# Patient Record
Sex: Female | Born: 1960 | Hispanic: No | Marital: Married | Smoking: Never smoker
Health system: Southern US, Community
[De-identification: ages and names within clinical notes are randomized; demographics above are authoritative.]

## PROBLEM LIST (undated history)

## (undated) DIAGNOSIS — E119 Type 2 diabetes mellitus without complications: Secondary | ICD-10-CM

## (undated) DIAGNOSIS — I10 Essential (primary) hypertension: Secondary | ICD-10-CM

## (undated) DIAGNOSIS — E785 Hyperlipidemia, unspecified: Secondary | ICD-10-CM

## (undated) HISTORY — DX: Hyperlipidemia, unspecified: E78.5

## (undated) HISTORY — DX: Type 2 diabetes mellitus without complications: E11.9

## (undated) HISTORY — DX: Essential (primary) hypertension: I10

---

## 2021-03-07 ENCOUNTER — Other Ambulatory Visit (HOSPITAL_COMMUNITY): Payer: Self-pay | Admitting: Internal Medicine

## 2021-03-07 DIAGNOSIS — Z1231 Encounter for screening mammogram for malignant neoplasm of breast: Secondary | ICD-10-CM

## 2021-03-07 DIAGNOSIS — M810 Age-related osteoporosis without current pathological fracture: Secondary | ICD-10-CM

## 2021-03-23 ENCOUNTER — Other Ambulatory Visit (HOSPITAL_COMMUNITY): Payer: Self-pay

## 2021-03-23 ENCOUNTER — Ambulatory Visit (HOSPITAL_COMMUNITY): Payer: Self-pay

## 2021-05-11 ENCOUNTER — Ambulatory Visit (HOSPITAL_COMMUNITY)
Admission: RE | Admit: 2021-05-11 | Discharge: 2021-05-11 | Disposition: A | Discharge status: Home / Self Care | Payer: 59 | Source: Ambulatory Visit | Attending: Internal Medicine | Admitting: Internal Medicine

## 2021-05-11 ENCOUNTER — Other Ambulatory Visit: Payer: Self-pay

## 2021-05-11 DIAGNOSIS — M810 Age-related osteoporosis without current pathological fracture: Secondary | ICD-10-CM | POA: Insufficient documentation

## 2021-05-11 DIAGNOSIS — Z1231 Encounter for screening mammogram for malignant neoplasm of breast: Secondary | ICD-10-CM | POA: Insufficient documentation

## 2021-08-09 ENCOUNTER — Other Ambulatory Visit: Payer: Self-pay

## 2021-08-09 ENCOUNTER — Encounter: Payer: Self-pay | Admitting: Obstetrics & Gynecology

## 2021-08-09 ENCOUNTER — Ambulatory Visit: Payer: 59 | Admitting: Obstetrics & Gynecology

## 2021-08-09 ENCOUNTER — Other Ambulatory Visit (HOSPITAL_COMMUNITY)
Admission: RE | Admit: 2021-08-09 | Discharge: 2021-08-09 | Disposition: A | Discharge status: Home / Self Care | Payer: 59 | Source: Ambulatory Visit | Attending: Obstetrics & Gynecology | Admitting: Obstetrics & Gynecology

## 2021-08-09 VITALS — BP 107/69 | HR 82 | Ht 65.0 in | Wt 196.2 lb

## 2021-08-09 DIAGNOSIS — Z01419 Encounter for gynecological examination (general) (routine) without abnormal findings: Secondary | ICD-10-CM | POA: Insufficient documentation

## 2021-08-09 DIAGNOSIS — N393 Stress incontinence (female) (male): Secondary | ICD-10-CM | POA: Diagnosis not present

## 2021-08-09 DIAGNOSIS — Z124 Encounter for screening for malignant neoplasm of cervix: Secondary | ICD-10-CM | POA: Diagnosis not present

## 2021-08-09 NOTE — Progress Notes (Signed)
/    WELL-WOMAN EXAMINATION Patient name: Summer Villarreal MRN [id number]  Date of birth: 31-Oct-1960 Chief Complaint:   Gynecologic Exam  History of Present Illness:   Summer Villarreal is a 60 y.o. PM female being seen today for a routine well-woman exam.   Presents with interpreter- Urdu- present for the entire visit  Today she notes no bleeding since going through menopause.  Denies vaginal discharge, itching or irritation.  Denies pelvic or abdominal pain.  She does note on occasion that when she lifts something heavy or bends down she may leak a few drops of urine.  Denies urgency or frequency.   She also on occasion will have RLQ pain, per pt it seems related to her diet especially if she eats a lot of sugar.  No LMP recorded. Patient is postmenopausal.   Last pap ~25yrs ago.  Last mammogram: 05/2021. Last colonoscopy: 2-11yrs ago- [facility]  Depression screen [facility] 2/9 08/09/2021  Decreased Interest 0  Down, Depressed, Hopeless 0  PHQ - 2 Score 0  Altered sleeping 0  Tired, decreased energy 0  Change in appetite 0  Feeling bad or failure about yourself  0  Trouble concentrating 0  Moving slowly or fidgety/restless 0  Suicidal thoughts 0  PHQ-9 Score 0      Review of Systems:   Pertinent items are noted in HPI Denies any headaches, blurred vision, fatigue, shortness of breath, chest pain, abdominal pain, bowel movements, or intercourse unless otherwise stated above.  Pertinent History Reviewed:  Reviewed past medical,surgical, social and family history.  Reviewed problem list, medications and allergies. Physical Assessment:   Vitals:   08/09/21 1530  BP: 107/69  Pulse: 82  Weight: 196 lb 3.2 oz (89 kg)  Height: 5\' 5"  (1.651 m)  Body mass index is 32.65 kg/m.        Physical Examination:   General appearance - well appearing, and in no distress  Mental status - alert, oriented to person, place, and time  Psych:  She has a normal mood and affect  Skin - warm  and dry, normal color, no suspicious lesions noted  Chest - effort normal, all lung fields clear to auscultation bilaterally  Heart - normal rate and regular rhythm  Neck:  midline trachea, no thyromegaly or nodules  Breasts - breasts appear normal, no suspicious masses, no skin or nipple changes or  axillary nodes  Abdomen - soft, nontender, nondistended, no masses or organomegaly  Pelvic - VULVA: normal appearing vulva with no masses, tenderness or lesions  VAGINA: normal appearing vagina with normal color and discharge, no lesions  CERVIX: normal appearing cervix without discharge or lesions, no CMT  Thin prep pap is done with HR HPV cotesting  UTERUS: uterus is felt to be normal size, shape, consistency and nontender   ADNEXA: No adnexal masses or tenderness noted.  Extremities:  No swelling or varicosities noted  Chaperone:     Assessment & Plan:  1) Well-Woman Exam -pap collected reviewed guidelines -encouraged mammogram yearly  2) Stress incontinence -reviewed conservative therapy -should this become a more aggravating problem, RTC  Meds: No orders of the defined types were placed in this encounter.   Follow-up: Return in about 1 year (around 08/09/2022) for Annual.   08/11/2022, DO Attending Obstetrician & Gynecologist, [facility] for [facility], [facility]

## 2021-08-16 LAB — CYTOLOGY - PAP
Comment: NEGATIVE
Diagnosis: NEGATIVE
High risk HPV: NEGATIVE

## 2021-08-18 ENCOUNTER — Telehealth: Payer: Self-pay

## 2021-08-18 NOTE — Telephone Encounter (Signed)
-----   Message from Myna Hidalgo, DO sent at 08/17/2021  5:54 PM EST ----- Pap/HPV negative

## 2021-08-18 NOTE — Telephone Encounter (Signed)
Due to language barrier, language line used to interpret. Two identifiers used. Test results given to pt per Dr Charlotta Newton. Pt confirmed understanding.

## 2021-11-28 DIAGNOSIS — I10 Essential (primary) hypertension: Secondary | ICD-10-CM | POA: Diagnosis not present

## 2021-11-28 DIAGNOSIS — Z6832 Body mass index (BMI) 32.0-32.9, adult: Secondary | ICD-10-CM | POA: Diagnosis not present

## 2021-11-28 DIAGNOSIS — E1143 Type 2 diabetes mellitus with diabetic autonomic (poly)neuropathy: Secondary | ICD-10-CM | POA: Diagnosis not present

## 2021-11-28 DIAGNOSIS — E7849 Other hyperlipidemia: Secondary | ICD-10-CM | POA: Diagnosis not present

## 2022-03-27 DIAGNOSIS — I10 Essential (primary) hypertension: Secondary | ICD-10-CM | POA: Diagnosis not present

## 2022-03-27 DIAGNOSIS — E7849 Other hyperlipidemia: Secondary | ICD-10-CM | POA: Diagnosis not present

## 2022-03-27 DIAGNOSIS — Z0000 Encounter for general adult medical examination without abnormal findings: Secondary | ICD-10-CM | POA: Diagnosis not present

## 2022-03-27 DIAGNOSIS — Z6832 Body mass index (BMI) 32.0-32.9, adult: Secondary | ICD-10-CM | POA: Diagnosis not present

## 2022-03-27 DIAGNOSIS — E1143 Type 2 diabetes mellitus with diabetic autonomic (poly)neuropathy: Secondary | ICD-10-CM | POA: Diagnosis not present

## 2022-03-29 ENCOUNTER — Other Ambulatory Visit (HOSPITAL_COMMUNITY): Payer: Self-pay | Admitting: Internal Medicine

## 2022-03-29 DIAGNOSIS — Z1231 Encounter for screening mammogram for malignant neoplasm of breast: Secondary | ICD-10-CM

## 2022-04-19 ENCOUNTER — Ambulatory Visit (HOSPITAL_COMMUNITY): Payer: Self-pay

## 2022-05-29 ENCOUNTER — Ambulatory Visit (HOSPITAL_COMMUNITY)
Admission: RE | Admit: 2022-05-29 | Discharge: 2022-05-29 | Disposition: A | Discharge status: Home / Self Care | Payer: 59 | Source: Ambulatory Visit | Attending: Internal Medicine | Admitting: Internal Medicine

## 2022-05-29 DIAGNOSIS — Z1231 Encounter for screening mammogram for malignant neoplasm of breast: Secondary | ICD-10-CM | POA: Insufficient documentation

## 2022-08-10 DIAGNOSIS — E1143 Type 2 diabetes mellitus with diabetic autonomic (poly)neuropathy: Secondary | ICD-10-CM | POA: Diagnosis not present

## 2022-08-10 DIAGNOSIS — I10 Essential (primary) hypertension: Secondary | ICD-10-CM | POA: Diagnosis not present

## 2022-08-10 DIAGNOSIS — E7849 Other hyperlipidemia: Secondary | ICD-10-CM | POA: Diagnosis not present

## 2022-08-10 DIAGNOSIS — Z6831 Body mass index (BMI) 31.0-31.9, adult: Secondary | ICD-10-CM | POA: Diagnosis not present

## 2022-12-25 DIAGNOSIS — E669 Obesity, unspecified: Secondary | ICD-10-CM | POA: Diagnosis not present

## 2022-12-25 DIAGNOSIS — E1143 Type 2 diabetes mellitus with diabetic autonomic (poly)neuropathy: Secondary | ICD-10-CM | POA: Diagnosis not present

## 2022-12-25 DIAGNOSIS — E7849 Other hyperlipidemia: Secondary | ICD-10-CM | POA: Diagnosis not present

## 2022-12-25 DIAGNOSIS — Z6831 Body mass index (BMI) 31.0-31.9, adult: Secondary | ICD-10-CM | POA: Diagnosis not present

## 2022-12-25 DIAGNOSIS — I10 Essential (primary) hypertension: Secondary | ICD-10-CM | POA: Diagnosis not present

## 2023-02-07 DIAGNOSIS — E1142 Type 2 diabetes mellitus with diabetic polyneuropathy: Secondary | ICD-10-CM | POA: Diagnosis not present

## 2023-02-07 DIAGNOSIS — E785 Hyperlipidemia, unspecified: Secondary | ICD-10-CM | POA: Diagnosis not present

## 2023-02-07 DIAGNOSIS — E669 Obesity, unspecified: Secondary | ICD-10-CM | POA: Diagnosis not present

## 2023-02-07 DIAGNOSIS — R32 Unspecified urinary incontinence: Secondary | ICD-10-CM | POA: Diagnosis not present

## 2023-02-07 DIAGNOSIS — I10 Essential (primary) hypertension: Secondary | ICD-10-CM | POA: Diagnosis not present

## 2023-02-07 DIAGNOSIS — Z6832 Body mass index (BMI) 32.0-32.9, adult: Secondary | ICD-10-CM | POA: Diagnosis not present

## 2023-02-07 DIAGNOSIS — Z7984 Long term (current) use of oral hypoglycemic drugs: Secondary | ICD-10-CM | POA: Diagnosis not present

## 2023-02-07 DIAGNOSIS — R600 Localized edema: Secondary | ICD-10-CM | POA: Diagnosis not present

## 2023-04-10 IMAGING — MG MM DIGITAL SCREENING BILAT W/ TOMO AND CAD
6 of 12 series · 6 of 36 positions shown · non-contrast
Comparison: Previous exam(s).

CLINICAL DATA: Screening.

EXAM:
DIGITAL SCREENING BILATERAL MAMMOGRAM WITH TOMOSYNTHESIS AND CAD
TECHNIQUE: Bilateral screening digital craniocaudal and mediolateral oblique
mammograms were obtained. Bilateral screening digital breast
tomosynthesis was performed. The images were evaluated with
computer-aided detection.

[R CC synth-2D (1 of 2)]
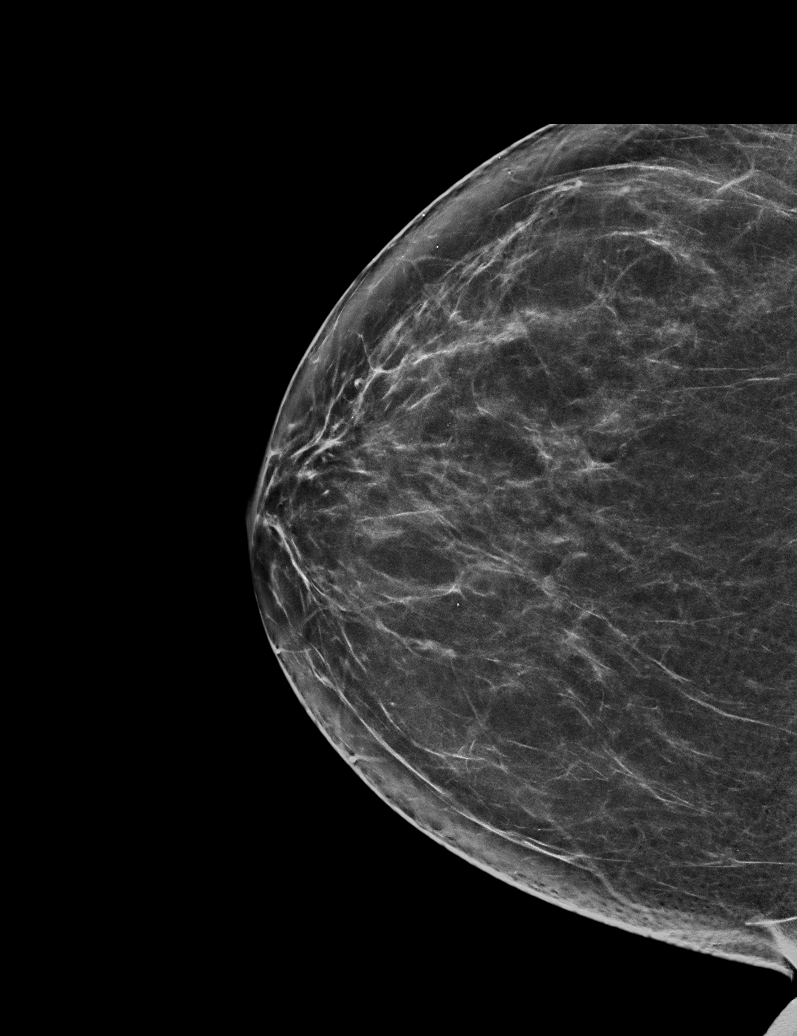

[R CC synth-2D (2 of 2)]
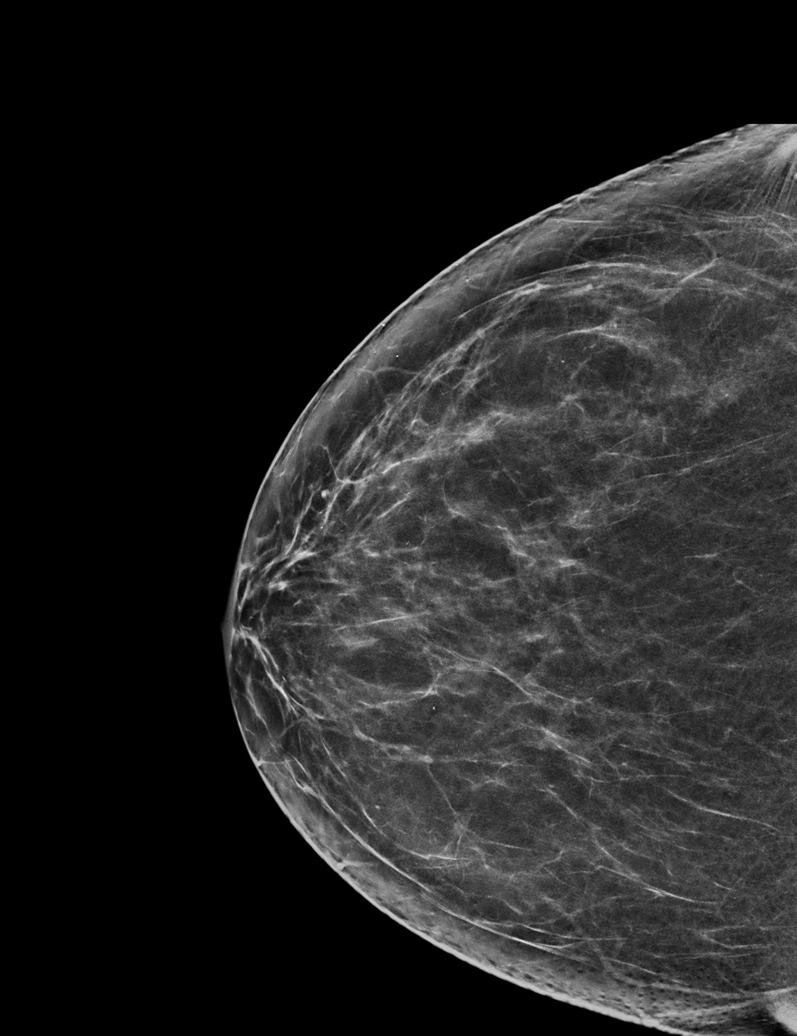

[L MLO synth-2D]
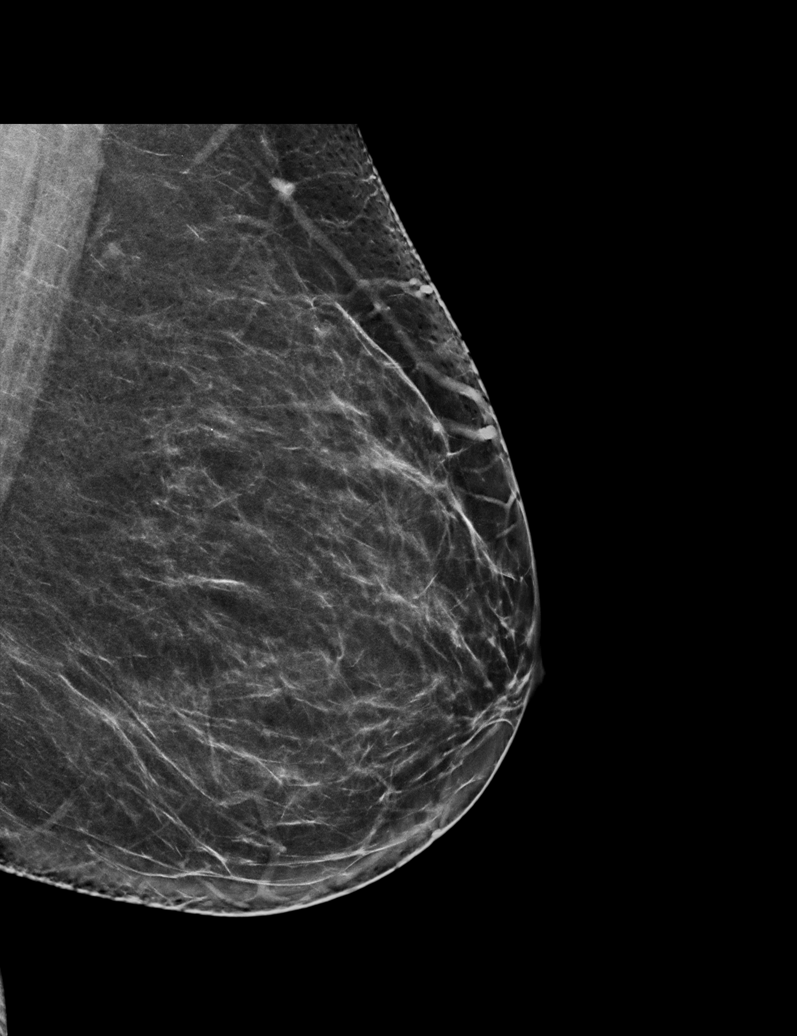

[L CC synth-2D]
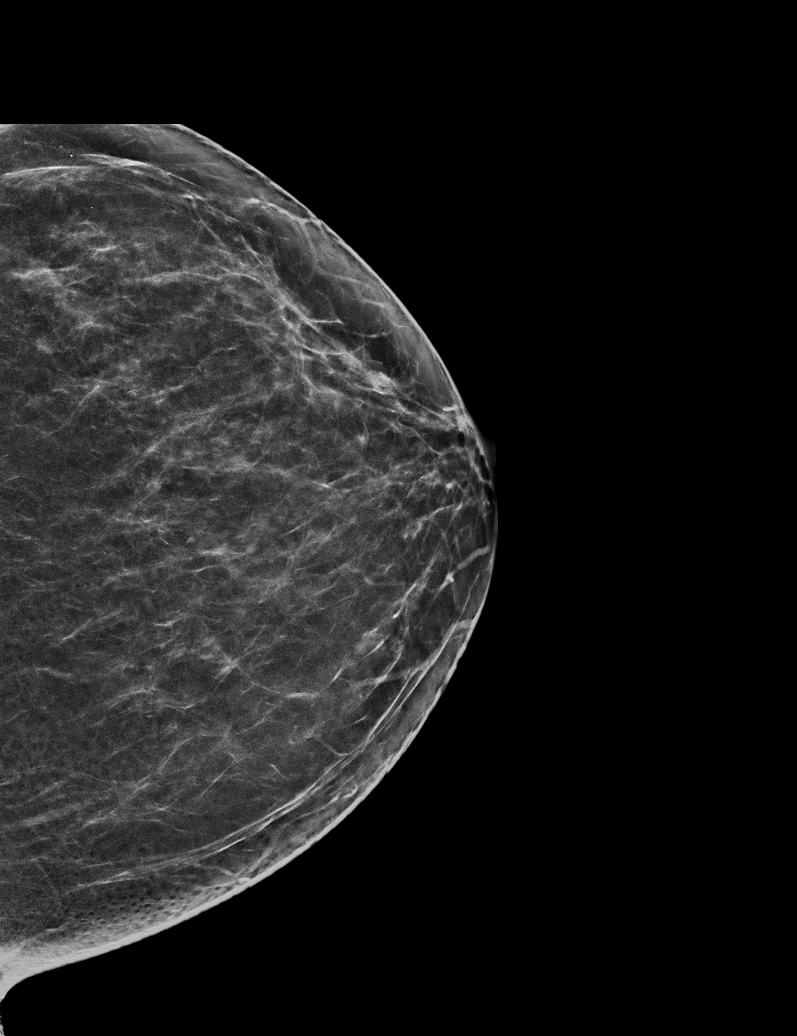

[R MLO synth-2D (1 of 2)]
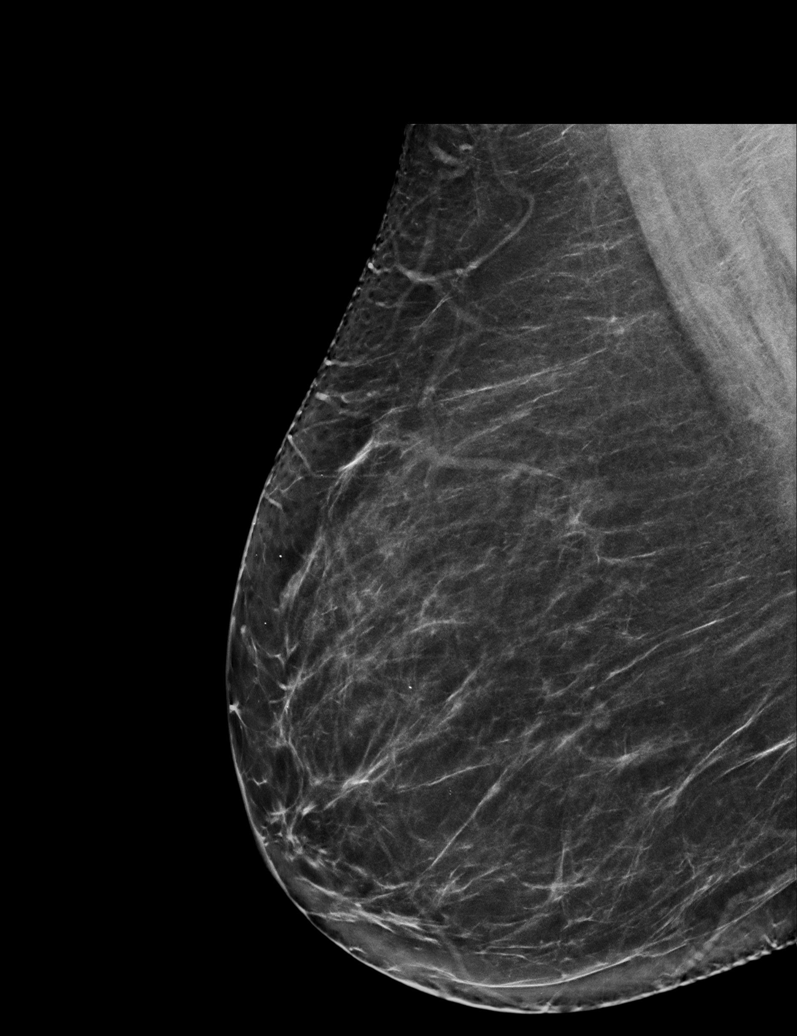

[R MLO synth-2D (2 of 2)]
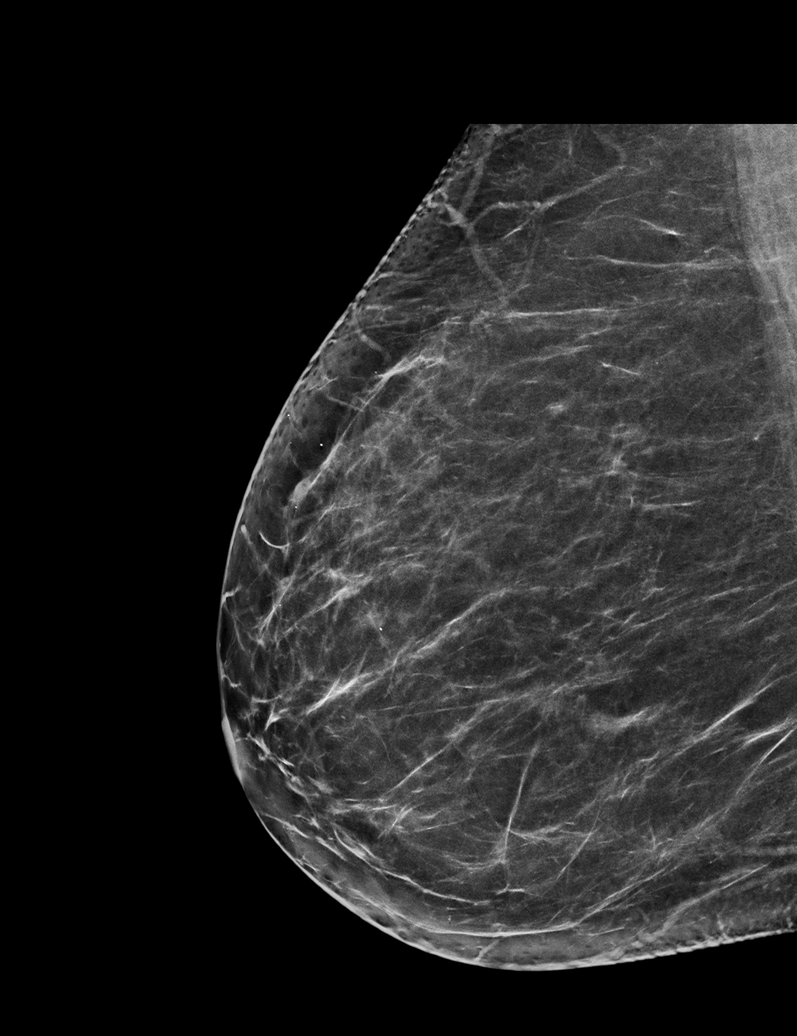

[6 of 36 positions shown; findings below may reference images not displayed]

ACR Breast Density Category b: There are scattered areas of
fibroglandular density.
FINDINGS: There are no findings suspicious for malignancy.
IMPRESSION: No mammographic evidence of malignancy. A result letter of this
screening mammogram will be mailed directly to the patient.

RECOMMENDATION:
Screening mammogram in one year. (Code:51-O-LD2)

BI-RADS CATEGORY  1: Negative.

## 2023-05-03 ENCOUNTER — Other Ambulatory Visit (HOSPITAL_COMMUNITY): Payer: Self-pay | Admitting: Internal Medicine

## 2023-05-03 DIAGNOSIS — Z1231 Encounter for screening mammogram for malignant neoplasm of breast: Secondary | ICD-10-CM

## 2023-05-10 DIAGNOSIS — Z0000 Encounter for general adult medical examination without abnormal findings: Secondary | ICD-10-CM | POA: Diagnosis not present

## 2023-05-10 DIAGNOSIS — Z6831 Body mass index (BMI) 31.0-31.9, adult: Secondary | ICD-10-CM | POA: Diagnosis not present

## 2023-06-01 ENCOUNTER — Ambulatory Visit (HOSPITAL_COMMUNITY)
Admission: RE | Admit: 2023-06-01 | Discharge: 2023-06-01 | Disposition: A | Discharge status: Home / Self Care | Payer: 59 | Source: Ambulatory Visit | Attending: Internal Medicine | Admitting: Internal Medicine

## 2023-06-01 ENCOUNTER — Encounter (HOSPITAL_COMMUNITY): Payer: Self-pay

## 2023-06-01 DIAGNOSIS — Z1231 Encounter for screening mammogram for malignant neoplasm of breast: Secondary | ICD-10-CM | POA: Insufficient documentation

## 2023-08-13 DIAGNOSIS — J180 Bronchopneumonia, unspecified organism: Secondary | ICD-10-CM | POA: Diagnosis not present

## 2023-08-13 DIAGNOSIS — I10 Essential (primary) hypertension: Secondary | ICD-10-CM | POA: Diagnosis not present

## 2023-08-13 DIAGNOSIS — E7849 Other hyperlipidemia: Secondary | ICD-10-CM | POA: Diagnosis not present

## 2023-08-13 DIAGNOSIS — N182 Chronic kidney disease, stage 2 (mild): Secondary | ICD-10-CM | POA: Diagnosis not present

## 2023-08-13 DIAGNOSIS — Z6830 Body mass index (BMI) 30.0-30.9, adult: Secondary | ICD-10-CM | POA: Diagnosis not present

## 2023-08-13 DIAGNOSIS — E1122 Type 2 diabetes mellitus with diabetic chronic kidney disease: Secondary | ICD-10-CM | POA: Diagnosis not present

## 2023-08-13 DIAGNOSIS — E669 Obesity, unspecified: Secondary | ICD-10-CM | POA: Diagnosis not present

## 2023-12-20 DIAGNOSIS — N182 Chronic kidney disease, stage 2 (mild): Secondary | ICD-10-CM | POA: Diagnosis not present

## 2023-12-20 DIAGNOSIS — E1122 Type 2 diabetes mellitus with diabetic chronic kidney disease: Secondary | ICD-10-CM | POA: Diagnosis not present

## 2023-12-20 DIAGNOSIS — Z6831 Body mass index (BMI) 31.0-31.9, adult: Secondary | ICD-10-CM | POA: Diagnosis not present

## 2023-12-20 DIAGNOSIS — I10 Essential (primary) hypertension: Secondary | ICD-10-CM | POA: Diagnosis not present

## 2023-12-20 DIAGNOSIS — E66812 Obesity, class 2: Secondary | ICD-10-CM | POA: Diagnosis not present

## 2023-12-20 DIAGNOSIS — E7849 Other hyperlipidemia: Secondary | ICD-10-CM | POA: Diagnosis not present

## 2023-12-20 DIAGNOSIS — Z1159 Encounter for screening for other viral diseases: Secondary | ICD-10-CM | POA: Diagnosis not present

## 2024-01-31 DIAGNOSIS — L720 Epidermal cyst: Secondary | ICD-10-CM | POA: Diagnosis not present

## 2024-01-31 DIAGNOSIS — Z6831 Body mass index (BMI) 31.0-31.9, adult: Secondary | ICD-10-CM | POA: Diagnosis not present

## 2024-04-23 ENCOUNTER — Other Ambulatory Visit (HOSPITAL_COMMUNITY): Payer: Self-pay | Admitting: Internal Medicine

## 2024-04-23 DIAGNOSIS — Z1231 Encounter for screening mammogram for malignant neoplasm of breast: Secondary | ICD-10-CM

## 2024-05-22 DIAGNOSIS — E66812 Obesity, class 2: Secondary | ICD-10-CM | POA: Diagnosis not present

## 2024-05-22 DIAGNOSIS — N182 Chronic kidney disease, stage 2 (mild): Secondary | ICD-10-CM | POA: Diagnosis not present

## 2024-05-22 DIAGNOSIS — Z6829 Body mass index (BMI) 29.0-29.9, adult: Secondary | ICD-10-CM | POA: Diagnosis not present

## 2024-05-22 DIAGNOSIS — E1122 Type 2 diabetes mellitus with diabetic chronic kidney disease: Secondary | ICD-10-CM | POA: Diagnosis not present

## 2024-05-22 DIAGNOSIS — I10 Essential (primary) hypertension: Secondary | ICD-10-CM | POA: Diagnosis not present

## 2024-05-22 DIAGNOSIS — E7849 Other hyperlipidemia: Secondary | ICD-10-CM | POA: Diagnosis not present

## 2024-06-25 ENCOUNTER — Ambulatory Visit (HOSPITAL_COMMUNITY)
Admission: RE | Admit: 2024-06-25 | Discharge: 2024-06-25 | Disposition: A | Discharge status: Home / Self Care | Source: Ambulatory Visit | Attending: Internal Medicine | Admitting: Internal Medicine

## 2024-06-25 ENCOUNTER — Encounter (HOSPITAL_COMMUNITY): Payer: Self-pay

## 2024-06-25 DIAGNOSIS — Z1231 Encounter for screening mammogram for malignant neoplasm of breast: Secondary | ICD-10-CM | POA: Insufficient documentation

## 2024-06-27 ENCOUNTER — Inpatient Hospital Stay
Admission: RE | Admit: 2024-06-27 | Discharge: 2024-06-27 | Disposition: A | Discharge status: Home / Self Care | Payer: Self-pay | Source: Ambulatory Visit | Attending: Internal Medicine | Admitting: Internal Medicine

## 2024-06-27 ENCOUNTER — Other Ambulatory Visit (HOSPITAL_COMMUNITY): Payer: Self-pay | Admitting: Internal Medicine

## 2024-06-27 DIAGNOSIS — Z1231 Encounter for screening mammogram for malignant neoplasm of breast: Secondary | ICD-10-CM

## 2024-07-25 DIAGNOSIS — N182 Chronic kidney disease, stage 2 (mild): Secondary | ICD-10-CM | POA: Diagnosis not present

## 2024-07-25 DIAGNOSIS — E1122 Type 2 diabetes mellitus with diabetic chronic kidney disease: Secondary | ICD-10-CM | POA: Diagnosis not present

## 2024-07-25 DIAGNOSIS — E7849 Other hyperlipidemia: Secondary | ICD-10-CM | POA: Diagnosis not present

## 2024-08-05 ENCOUNTER — Other Ambulatory Visit (HOSPITAL_COMMUNITY)
Admission: RE | Admit: 2024-08-05 | Discharge: 2024-08-05 | Disposition: A | Discharge status: Home / Self Care | Source: Ambulatory Visit | Attending: Internal Medicine | Admitting: Internal Medicine

## 2024-08-05 DIAGNOSIS — E1122 Type 2 diabetes mellitus with diabetic chronic kidney disease: Secondary | ICD-10-CM | POA: Insufficient documentation

## 2024-08-06 LAB — MICROALBUMIN / CREATININE URINE RATIO
Creatinine, Urine: 95.9 mg/dL
Microalb Creat Ratio: 3 mg/g{creat} (ref 0–29)
Microalb, Ur: 3.2 ug/mL — ABNORMAL HIGH

## 2024-08-14 ENCOUNTER — Encounter: Payer: Self-pay | Admitting: Obstetrics & Gynecology

## 2024-08-14 ENCOUNTER — Other Ambulatory Visit (HOSPITAL_COMMUNITY)
Admission: RE | Admit: 2024-08-14 | Discharge: 2024-08-14 | Disposition: A | Discharge status: Home / Self Care | Source: Ambulatory Visit | Attending: Obstetrics & Gynecology | Admitting: Obstetrics & Gynecology

## 2024-08-14 ENCOUNTER — Ambulatory Visit: Admitting: Obstetrics & Gynecology

## 2024-08-14 VITALS — BP 125/75 | HR 69 | Ht 66.0 in | Wt 176.0 lb

## 2024-08-14 DIAGNOSIS — Z1211 Encounter for screening for malignant neoplasm of colon: Secondary | ICD-10-CM | POA: Diagnosis not present

## 2024-08-14 DIAGNOSIS — Z1151 Encounter for screening for human papillomavirus (HPV): Secondary | ICD-10-CM | POA: Diagnosis not present

## 2024-08-14 DIAGNOSIS — R102 Pelvic and perineal pain unspecified side: Secondary | ICD-10-CM

## 2024-08-14 DIAGNOSIS — Z01419 Encounter for gynecological examination (general) (routine) without abnormal findings: Secondary | ICD-10-CM | POA: Diagnosis not present

## 2024-08-14 DIAGNOSIS — N898 Other specified noninflammatory disorders of vagina: Secondary | ICD-10-CM | POA: Diagnosis not present

## 2024-08-14 NOTE — Progress Notes (Signed)
 WELL-WOMAN EXAMINATION Patient name: Summer Villarreal MRN [id number]  Date of birth: 05-Sep-1961 Chief Complaint:   Gynecologic Exam  History of Present Illness:   Summer Villarreal is a 63 y.o. [id number] PM female being seen today for a routine well-woman exam.   Denies vaginal bleeding, discharge or irritation.  Notes external itching- comes and goes- ongoing issue for at least several months.  Not sure if it's related to her DM or something else.  She has not taken any OTC medication for her symptoms  Pain on right side for the past several years- uncomfortable sharp pain last for a few minutes then goes away.  Notes she has mentioned to PCP, but no work up has been completed.  On occasion may note constipation also notes regular loose stools and flatulence.  Denies nausea or vomiting  Urdu interpreter present though pt understands English well  No LMP recorded. Patient is postmenopausal.  The current method of family planning is post menopausal status.    Last pap 07/2021- due today.  Last mammogram: 06/2024. Last colonoscopy: not sure- notes h/o of prior colonoscopy     08/09/2021    3:31 PM  Depression screen PHQ 2/9  Decreased Interest 0  Down, Depressed, Hopeless 0  PHQ - 2 Score 0  Altered sleeping 0  Tired, decreased energy 0  Change in appetite 0  Feeling bad or failure about yourself  0  Trouble concentrating 0  Moving slowly or fidgety/restless 0  Suicidal thoughts 0  PHQ-9 Score 0      Data saved with a previous flowsheet row definition      Review of Systems:   Pertinent items are noted in HPI Denies any headaches, blurred vision, fatigue, shortness of breath, chest pain, abdominal pain, bowel movements, urination, or intercourse unless otherwise stated above.  Pertinent History Reviewed:  Reviewed past medical,surgical, social and family history.  Reviewed problem list, medications and allergies. Physical Assessment:   Vitals:   08/14/24 1035  08/14/24 1041  BP:  125/75  Pulse:  69  Weight: 176 lb (79.8 kg) 176 lb (79.8 kg)  Height: 5' 5 (1.651 m) 5' 6 (1.676 m)  Body mass index is 28.41 kg/m.        Physical Examination:   General appearance - well appearing, and in no distress  Mental status - alert, oriented to person, place, and time  Psych:  She has a normal mood and affect  Skin - warm and dry, normal color, no suspicious lesions noted  Chest - effort normal, all lung fields clear to auscultation bilaterally  Heart - normal rate and regular rhythm  Neck:  midline trachea, no thyromegaly or nodules  Breasts - breasts appear normal, no suspicious masses, no skin or nipple changes or  axillary nodes  Abdomen - soft, nontender, nondistended, no masses or organomegaly  Pelvic - VULVA: normal appearing vulva with no masses, tenderness or lesions  VAGINA: normal appearing vagina with normal color and discharge, no lesions  CERVIX: normal appearing cervix without discharge or lesions, no CMT  Thin prep pap is done with HR HPV cotesting  UTERUS: uterus is felt to be normal size, shape, consistency and nontender   ADNEXA: No adnexal masses or tenderness noted.  Extremities:  No swelling or varicosities noted  Chaperone: Alan Fischer     Assessment & Plan:  1) Well-Woman Exam -pap collected, reviewed ASCCP guidelines -mammogram up to date -referral to GI  2) Vaginal itching -no abnormalities noted -await results  of pap/vaginitis for treatment  3) Pelvic pain -plan for pelvic US  to r/o underling etiology  -suspect symptoms may be GI in nature  Orders Placed This Encounter  Procedures   US  PELVIC COMPLETE WITH TRANSVAGINAL   Ambulatory referral to Gastroenterology    Meds: No orders of the defined types were placed in this encounter.   Follow-up: Return in about 1 year (around 08/14/2025) for pelvic US , 14yr annual.   Shawntavia Saunders, DO Attending Obstetrician & Gynecologist, Faculty Practice Center for  [facility], [facility]

## 2024-08-18 ENCOUNTER — Ambulatory Visit: Payer: Self-pay | Admitting: Obstetrics & Gynecology

## 2024-08-18 LAB — CYTOLOGY - PAP
Comment: NEGATIVE
Diagnosis: NEGATIVE
High risk HPV: NEGATIVE

## 2024-08-19 ENCOUNTER — Encounter (INDEPENDENT_AMBULATORY_CARE_PROVIDER_SITE_OTHER): Payer: Self-pay | Admitting: *Deleted

## 2024-08-21 ENCOUNTER — Other Ambulatory Visit

## 2024-08-21 DIAGNOSIS — R9389 Abnormal findings on diagnostic imaging of other specified body structures: Secondary | ICD-10-CM | POA: Diagnosis not present

## 2024-08-21 DIAGNOSIS — R102 Pelvic and perineal pain unspecified side: Secondary | ICD-10-CM

## 2024-08-21 NOTE — Progress Notes (Signed)
 PELVIC US  TA/TV: heterogeneous retroverted uterus with multiple simple myometrial cysts and linear striations,avascular homogeneous thickened endometrium,EEC 9.7 mm,normal ovaries,ovaries appear mobile,no free fluid,no pain during ultrasound  Chaperone Peggy
